# Patient Record
Sex: Female | Born: 2000 | Race: White | Hispanic: No | Marital: Single | Smoking: Never smoker
Health system: Southern US, Community
[De-identification: ages and names within clinical notes are randomized; demographics above are authoritative.]

## PROBLEM LIST (undated history)

## (undated) DIAGNOSIS — F988 Other specified behavioral and emotional disorders with onset usually occurring in childhood and adolescence: Secondary | ICD-10-CM

## (undated) HISTORY — PX: HERNIA REPAIR: SHX51

---
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
## 2020-03-15 DIAGNOSIS — F900 Attention-deficit hyperactivity disorder, predominantly inattentive type: Secondary | ICD-10-CM | POA: Diagnosis not present

## 2020-03-15 DIAGNOSIS — K580 Irritable bowel syndrome with diarrhea: Secondary | ICD-10-CM | POA: Diagnosis not present

## 2020-03-15 DIAGNOSIS — R1084 Generalized abdominal pain: Secondary | ICD-10-CM | POA: Diagnosis not present

## 2020-04-03 DIAGNOSIS — J0100 Acute maxillary sinusitis, unspecified: Secondary | ICD-10-CM | POA: Diagnosis not present

## 2020-04-26 DIAGNOSIS — K580 Irritable bowel syndrome with diarrhea: Secondary | ICD-10-CM | POA: Diagnosis not present

## 2020-04-26 DIAGNOSIS — F900 Attention-deficit hyperactivity disorder, predominantly inattentive type: Secondary | ICD-10-CM | POA: Diagnosis not present

## 2020-05-15 DIAGNOSIS — J3089 Other allergic rhinitis: Secondary | ICD-10-CM | POA: Diagnosis not present

## 2020-05-21 DIAGNOSIS — J00 Acute nasopharyngitis [common cold]: Secondary | ICD-10-CM | POA: Diagnosis not present

## 2020-06-06 DIAGNOSIS — Z20822 Contact with and (suspected) exposure to covid-19: Secondary | ICD-10-CM | POA: Diagnosis not present

## 2020-06-06 DIAGNOSIS — R059 Cough, unspecified: Secondary | ICD-10-CM | POA: Diagnosis not present

## 2020-06-06 DIAGNOSIS — J0140 Acute pansinusitis, unspecified: Secondary | ICD-10-CM | POA: Diagnosis not present

## 2020-06-06 DIAGNOSIS — Z1152 Encounter for screening for COVID-19: Secondary | ICD-10-CM | POA: Diagnosis not present

## 2020-06-20 DIAGNOSIS — R1030 Lower abdominal pain, unspecified: Secondary | ICD-10-CM | POA: Diagnosis not present

## 2020-06-20 DIAGNOSIS — K921 Melena: Secondary | ICD-10-CM | POA: Diagnosis not present

## 2020-06-20 DIAGNOSIS — R194 Change in bowel habit: Secondary | ICD-10-CM | POA: Diagnosis not present

## 2021-05-10 ENCOUNTER — Encounter: Payer: Self-pay | Admitting: Nurse Practitioner

## 2021-05-10 ENCOUNTER — Ambulatory Visit (INDEPENDENT_AMBULATORY_CARE_PROVIDER_SITE_OTHER): Payer: 59 | Admitting: Nurse Practitioner

## 2021-05-10 ENCOUNTER — Other Ambulatory Visit: Payer: Self-pay

## 2021-05-10 VITALS — BP 102/66 | HR 89 | Temp 98.2°F | Ht 64.0 in | Wt 133.0 lb

## 2021-05-10 DIAGNOSIS — F419 Anxiety disorder, unspecified: Secondary | ICD-10-CM | POA: Diagnosis not present

## 2021-05-10 DIAGNOSIS — H9203 Otalgia, bilateral: Secondary | ICD-10-CM

## 2021-05-10 DIAGNOSIS — F322 Major depressive disorder, single episode, severe without psychotic features: Secondary | ICD-10-CM | POA: Diagnosis not present

## 2021-05-10 MED ORDER — ESCITALOPRAM OXALATE 10 MG PO TABS: 10.0000 mg | ORAL_TABLET | Freq: Every day | ORAL | 5 refills | Status: AC

## 2021-05-10 MED ORDER — OFLOXACIN 0.3 % OT SOLN: 5.0000 [drp] | Freq: Every day | OTIC | 0 refills | Status: DC

## 2021-05-10 NOTE — Assessment & Plan Note (Signed)
Completed PHQ -9, started patient on Lexapro 10 mg tablet by mouth daily, follow up in 6 weeks. Education provided, RX sent to pharmacy,

## 2021-05-10 NOTE — Progress Notes (Signed)
New Patient Note  RE: Tracie Bowen MRN: [id number] DOB: June 11, 2001 Date of Office Visit: 05/10/2021  Chief Complaint: Establish Care  History of Present Illness: Depression: Patient complains of depression. She complains of depressed mood, difficulty concentrating, and fatigue. Onset was approximately a few years ago, gradually worsening since that time.  She denies current suicidal and homicidal plan or intent.   Family history significant for no psychiatric illness.Possible organic causes contributing are: none.  Risk factors: positive family history in  mother Previous treatment includes Prozac and none. She complains of the following side effects from the treatment:  ineffective .   Anxiety: Patient complains of anxiety disorder.  She has the following symptoms: feelings of losing control, irritable. Onset of symptoms was approximately a few years ago, rapidly worsening since that time. She denies current suicidal and homicidal ideation. Family history significant for  personality disorder .Possible organic causes contributing are: none. Risk factors: previous episode of depression Previous treatment includes Prozac and none.  She complains of the following side effects from the treatment:  not effective .   Otalgia  There is pain in both ears. This is a new problem. The current episode started in the past 7 days. The problem occurs constantly. The problem has been unchanged. There has been no fever. The pain is moderate. Pertinent negatives include no coughing, ear discharge, hearing loss, rash or sore throat. She has tried nothing (sudafed) for the symptoms. The treatment provided mild relief.    Flowsheet Row Office Visit from 05/10/2021 in [facility]  PHQ-9 Total Score 20        GAD 7 : Generalized Anxiety Score 05/10/2021  Nervous, Anxious, on Edge 3  Control/stop worrying 3  Worry too much - different things 3  Trouble relaxing 3  Restless 1   Easily annoyed or irritable 3  Afraid - awful might happen 1  Total GAD 7 Score 17       Assessment and Plan: Tracie Bowen is a 20 y.o. female with: Depression, major, single episode, severe (HCC) Completed PHQ -9, started patient on Lexapro 10 mg tablet by mouth daily, follow up in 6 weeks. Education provided, RX sent to pharmacy,   Anxiety Completed GAD-7, started patient on Lexapro 10 mg tablet by mouth daily, follow up in 6 weeks. Education provided, RX sent to pharmacy,   Return in about 6 weeks (around 06/21/2021) for depression.   Diagnostics:   Past Medical History: Patient Active Problem List   Diagnosis Date Noted   Depression, major, single episode, severe (HCC) 05/10/2021   Anxiety 05/10/2021   Otalgia of both ears 05/10/2021   Past Medical History:  Diagnosis Date   ADD (attention deficit disorder)    Past Surgical History:  Medication List:  Current Outpatient Medications  Medication Sig Dispense Refill   amphetamine-dextroamphetamine (ADDERALL) 10 MG tablet TAKE 1 TABLET BY MOUTH EVERY DAY IN LATE AFTERNOON FOR ATTENTION DEFICET WHILE STUDYING     escitalopram (LEXAPRO) 10 MG tablet Take 1 tablet (10 mg total) by mouth daily. 30 tablet 5   ofloxacin (FLOXIN OTIC) 0.3 % OTIC solution Place 5 drops into both ears daily. Use 7 days and discard 2 mL 0   No current facility-administered medications for this visit.   Allergies: Allergies  Allergen Reactions   Penicillins Hives   Social History: Social History   Socioeconomic History   Marital status: Single    Spouse name: Not on file   Number of children: Not  on file   Years of education: Not on file   Highest education level: Not on file  Occupational History   Not on file  Tobacco Use   Smoking status: Never   Smokeless tobacco: Never  Vaping Use   Vaping Use: Never used  Substance and Sexual Activity   Alcohol use: Yes    Alcohol/week: 1.0 standard drink    Types: 1 Glasses of wine per  week   Drug use: Never   Sexual activity: Not Currently  Other Topics Concern   Not on file  Social History Narrative   Not on file   Social Determinants of Health   Financial Resource Strain: Not on file  Food Insecurity: Not on file  Transportation Needs: Not on file  Physical Activity: Not on file  Stress: Not on file  Social Connections: Not on file       Family History: Family History  Problem Relation Age of Onset   COPD Mother          Review of Systems  Constitutional: Negative.   HENT:  Positive for ear pain. Negative for ear discharge, hearing loss and sore throat.   Eyes: Negative.   Respiratory: Negative.  Negative for cough.   Cardiovascular: Negative.   Gastrointestinal: Negative.   Genitourinary: Negative.   Musculoskeletal: Negative.   Skin:  Negative for rash.  Psychiatric/Behavioral: Negative.    All other systems reviewed and are negative. Objective: BP 102/66   Pulse 89   Temp 98.2 F (36.8 C) (Temporal)   Ht 5\' 4"  (1.626 m)   Wt 133 lb (60.3 kg)   LMP 05/03/2021   BMI 22.83 kg/m  Body mass index is 22.83 kg/m. Physical Exam Vitals and nursing note reviewed.  Constitutional:      Appearance: Normal appearance.  HENT:     Head: Normocephalic.     Right Ear: Tenderness present. There is no impacted cerumen.     Left Ear: There is no impacted cerumen.     Nose: Nose normal.  Eyes:     Conjunctiva/sclera: Conjunctivae normal.  Cardiovascular:     Rate and Rhythm: Normal rate and regular rhythm.     Pulses: Normal pulses.     Heart sounds: Normal heart sounds.  Pulmonary:     Effort: Pulmonary effort is normal.     Breath sounds: Normal breath sounds.  Abdominal:     General: Bowel sounds are normal.  Skin:    General: Skin is warm.     Findings: No rash.  Neurological:     Mental Status: She is alert and oriented to person, place, and time.  Psychiatric:        Attention and Perception: Attention and perception normal.         Mood and Affect: Affect normal. Mood is anxious and depressed.        Behavior: Behavior is cooperative.        Thought Content: Thought content normal. Thought content does not include suicidal ideation. Thought content does not include suicidal plan.        Cognition and Memory: Cognition normal.        Judgment: Judgment normal.   The plan was reviewed with the patient/family, and all questions/concerned were addressed.  It was my pleasure to see Tracie Bowen today and participate in her care. Please feel free to contact me with any questions or concerns.  Sincerely,  Toni Amend NP [facility]

## 2021-05-10 NOTE — Assessment & Plan Note (Signed)
Completed GAD-7, started patient on Lexapro 10 mg tablet by mouth daily, follow up in 6 weeks. Education provided, RX sent to pharmacy,

## 2021-05-10 NOTE — Patient Instructions (Signed)
Earache, Adult An earache, or ear pain, can be caused by many things, including: An infection. Ear wax buildup. Ear pressure. Something in the ear that should not be there (foreign body). A sore throat. Tooth problems. Jaw problems. Treatment of the earache will depend on the cause. If the cause is not clear or cannot be determined, you may need to watch your symptoms until your earache goes away or until a cause is found. Follow these instructions at home: Medicines Take or apply over-the-counter and prescription medicines only as told by your health care provider. If you were prescribed an antibiotic medicine, use it as told by your health care provider. Do not stop using the antibiotic even if you start to feel better. Do not put anything in your ear other than medicine that is prescribed by your health care provider. Managing pain If directed, apply heat to the affected area as often as told by your health care provider. Use the heat source that your health care provider recommends, such as a moist heat pack or a heating pad. Place a towel between your skin and the heat source. Leave the heat on for 20-30 minutes. Remove the heat if your skin turns bright red. This is especially important if you are unable to feel pain, heat, or cold. You may have a greater risk of getting burned. If directed, put ice on the affected area as often as told by your health care provider. To do this:   Put ice in a plastic bag. Place a towel between your skin and the bag. Leave the ice on for 20 minutes, 2-3 times a day. General instructions Pay attention to any changes in your symptoms. Try resting in an upright position instead of lying down. This may help to reduce pressure in your ear and relieve pain. Chew gum if it helps to relieve your ear pain. Treat any allergies as told by your health care provider. Drink enough fluid to keep your urine pale yellow. It is up to you to get the results of any  tests that were done. Ask your health care provider, or the department that is doing the tests, when your results will be ready. Keep all follow-up visits as told by your health care provider. This is important. Contact a health care provider if: Your pain does not improve within 2 days. Your earache gets worse. You have new symptoms. You have a fever. Get help right away if you: Have a severe headache. Have a stiff neck. Have trouble swallowing. Have redness or swelling behind your ear. Have fluid or blood coming from your ear. Have hearing loss. Feel dizzy. Summary An earache, or ear pain, can be caused by many things. Treatment of the earache will depend on the cause. Follow recommendations from your health care provider to treat your ear pain. If the cause is not clear or cannot be determined, you may need to watch your symptoms until your earache goes away or until a cause is found. Keep all follow-up visits as told by your health care provider. This is important. This information is not intended to replace advice given to you by your health care provider. Make sure you discuss any questions you have with your health care provider. Document Revised: 02/21/2019 Document Reviewed: 02/21/2019 Elsevier Patient Education  2022 Elsevier Inc. Managing Anxiety, Adult After being diagnosed with an anxiety disorder, you may be relieved to know why you have felt or behaved a certain way. You may also feel overwhelmed  about the treatment ahead and what it will mean for your life. With care and support, you can manage this condition and recover from it. How to manage lifestyle changes Managing stress and anxiety Stress is your body's reaction to life changes and events, both good and bad. Most stress will last just a few hours, but stress can be ongoing and can lead to more than just stress. Although stress can play a major role in anxiety, it is not the same as anxiety. Stress is usually caused  by something external, such as a deadline, test, or competition. Stress normally passes after the triggering event has ended.  Anxiety is caused by something internal, such as imagining a terrible outcome or worrying that something will go wrong that will devastate you. Anxiety often does not go away even after the triggering event is over, and it can become long-term (chronic) worry. It is important to understand the differences between stress and anxiety and to manage your stress effectively so that it does not lead to an anxious response. Talk with your health care provider or a counselor to learn more about reducing anxiety and stress. He or she may suggest tension reduction techniques, such as: Music therapy. This can include creating or listening to music that you enjoy and that inspires you. Mindfulness-based meditation. This involves being aware of your normal breaths while not trying to control your breathing. It can be done while sitting or walking. Centering prayer. This involves focusing on a word, phrase, or sacred image that means something to you and brings you peace. Deep breathing. To do this, expand your stomach and inhale slowly through your nose. Hold your breath for 3-5 seconds. Then exhale slowly, letting your stomach muscles relax. Self-talk. This involves identifying thought patterns that lead to anxiety reactions and changing those patterns. Muscle relaxation. This involves tensing muscles and then relaxing them. Choose a tension reduction technique that suits your lifestyle and personality. These techniques take time and practice. Set aside 5-15 minutes a day to do them. Therapists can offer counseling and training in these techniques. The training to help with anxiety may be covered by some insurance plans. Other things you can do to manage stress and anxiety include: Keeping a stress/anxiety diary. This can help you learn what triggers your reaction and then learn ways to manage  your response. Thinking about how you react to certain situations. You may not be able to control everything, but you can control your response. Making time for activities that help you relax and not feeling guilty about spending your time in this way. Visual imagery and yoga can help you stay calm and relax.  Medicines Medicines can help ease symptoms. Medicines for anxiety include: Anti-anxiety drugs. Antidepressants. Medicines are often used as a primary treatment for anxiety disorder. Medicines will be prescribed by a health care provider. When used together, medicines, psychotherapy, and tension reduction techniques may be the most effective treatment. Relationships Relationships can play a big part in helping you recover. Try to spend more time connecting with trusted friends and family members. Consider going to couples counseling, taking family education classes, or going to family therapy. Therapy can help you and others better understand your condition. How to recognize changes in your anxiety Everyone responds differently to treatment for anxiety. Recovery from anxiety happens when symptoms decrease and stop interfering with your daily activities at home or work. This may mean that you will start to: Have better concentration and focus. Worry will interfere  less in your daily thinking. Sleep better. Be less irritable. Have more energy. Have improved memory. It is important to recognize when your condition is getting worse. Contact your health care provider if your symptoms interfere with home or work and you feel like your condition is not improving. Follow these instructions at home: Activity Exercise. Most adults should do the following: Exercise for at least 150 minutes each week. The exercise should increase your heart rate and make you sweat (moderate-intensity exercise). Strengthening exercises at least twice a week. Get the right amount and quality of sleep. Most adults  need 7-9 hours of sleep each night. Lifestyle  Eat a healthy diet that includes plenty of vegetables, fruits, whole grains, low-fat dairy products, and lean protein. Do not eat a lot of foods that are high in solid fats, added sugars, or salt. Make choices that simplify your life. Do not use any products that contain nicotine or tobacco, such as cigarettes, e-cigarettes, and chewing tobacco. If you need help quitting, ask your health care provider. Avoid caffeine, alcohol, and certain over-the-counter cold medicines. These may make you feel worse. Ask your pharmacist which medicines to avoid. General instructions Take over-the-counter and prescription medicines only as told by your health care provider. Keep all follow-up visits as told by your health care provider. This is important. Where to find support You can get help and support from these sources: Self-help groups. Online and [employer]. A trusted spiritual leader. Couples counseling. Family education classes. Family therapy. Where to find more information You may find that joining a support group helps you deal with your anxiety. The following sources can help you locate counselors or support groups near you: Mental Health America: www.mentalhealthamerica.net Anxiety and Depression Association of [place] (ADAA): ProgramCam.de [employer] on Mental Illness (NAMI): www.nami.org Contact a health care provider if you: Have a hard time staying focused or finishing daily tasks. Spend many hours a day feeling worried about everyday life. Become exhausted by worry. Start to have headaches, feel tense, or have nausea. Urinate more than normal. Have diarrhea. Get help right away if you have: A racing heart and shortness of breath. Thoughts of hurting yourself or others. If you ever feel like you may hurt yourself or others, or have thoughts about taking your own life, get help right away. You can go to your nearest  emergency department or call: Your local emergency services (911 in the [place] A suicide crisis helpline, such as the National Suicide Prevention Lifeline at 272-778-2413. This is open 24 hours a day. Summary Taking steps to learn and use tension reduction techniques can help calm you and help prevent triggering an anxiety reaction. When used together, medicines, psychotherapy, and tension reduction techniques may be the most effective treatment. Family, friends, and partners can play a big part in helping you recover from an anxiety disorder. This information is not intended to replace advice given to you by your health care provider. Make sure you discuss any questions you have with your health care provider. Document Revised: 12/16/2018 Document Reviewed: 12/16/2018 Elsevier Patient Education  2022 Elsevier Inc. Major Depressive Disorder, Adult Major depressive disorder (MDD) is a mental health condition. It may also be called clinical depression or unipolar depression. MDD causes symptoms of sadness, hopelessness, and loss of interest in things. These symptoms last most of the day, almost every day, for 2 weeks. MDD can also cause physical symptoms. It can interfere with relationships and with everyday activities, such as work, school, and  activities that are usually pleasant. MDD may be mild, moderate, or severe. It may be single-episode MDD, which happens once, or recurrent MDD, which may occur multiple times. What are the causes? The exact cause of this condition is not known. MDD is most likely caused by a combination of things, which may include: Your personality traits. Learned or conditioned behaviors or thoughts or feelings that reinforce negativity. Any alcohol or substance misuse. Long-term (chronic) physical or mental health illness. Going through a traumatic experience or major life changes. What increases the risk? The following factors may make someone more likely to develop  MDD: A family history of depression. Being a woman. Troubled family relationships. Abnormally low levels of certain brain chemicals. Traumatic or painful events in childhood, especially abuse or loss of a parent. A lot of stress from life experiences, such as poor living conditions or discrimination. Chronic physical illness or other mental health disorders. What are the signs or symptoms? The main symptoms of MDD usually include: Constant depressed or irritable mood. A loss of interest in things and activities. Other symptoms include: Sleeping or eating too much or too little. Unexplained weight gain or weight loss. Tiredness or low energy. Being agitated, restless, or weak. Feeling hopeless, worthless, or guilty. Trouble thinking clearly or making decisions. Thoughts of suicide or thoughts of harming others. Isolating oneself or avoiding other people or activities. Trouble completing tasks, work, or any normal obligations. Severe symptoms of this condition may include: Psychotic depression.This may include false beliefs, or delusions. It may also include seeing, hearing, tasting, smelling, or feeling things that are not real (hallucinations). Chronic depression or persistent depressive disorder. This is low-level depression that lasts for at least 2 years. Melancholic depression, or feeling extremely sad and hopeless. Catatonic depression, which includes trouble speaking and trouble moving. How is this diagnosed? This condition may be diagnosed based on: Your symptoms. Your medical and mental health history. You may be asked questions about your lifestyle, including any drug and alcohol use. A physical exam. Blood tests to rule out other conditions. MDD is confirmed if you have the following symptoms most of the day, nearly every day, in a 2-week period: Either a depressed mood or loss of interest. At least four other MDD symptoms. How is this treated? This condition is usually  treated by mental health professionals, such as psychologists, psychiatrists, and clinical social workers. You may need more than one type of treatment. Treatment may include: Psychotherapy, also called talk therapy or counseling. Types of psychotherapy include: Cognitive behavioral therapy (CBT). This teaches you to recognize unhealthy feelings, thoughts, and behaviors, and replace them with positive thoughts and actions. Interpersonal therapy (IPT). This helps you to improve the way you communicate with others or relate to them. Family therapy. This treatment includes members of your family. Medicines to treat anxiety and depression. These medicines help to balance the brain chemicals that affect your emotions. Lifestyle changes. You may be asked to: Limit alcohol use and avoid drug use. Get regular exercise. Get plenty of sleep. Make healthy eating choices. Spend more time outdoors. Brain stimulation. This may be done if symptoms are very severe and other treatments have not worked. Examples of this treatment are electroconvulsive therapy and transcranial magnetic stimulation. Follow these instructions at home: Activity Exercise regularly and spend time outdoors. Find activities that you enjoy doing, and make time to do them. Find healthy ways to manage stress, such as: Meditation or deep breathing. Spending time in nature. Journaling. Return to your  normal activities as told by your health care provider. Ask your health care provider what activities are safe for you. Alcohol and drug use If you drink alcohol: Limit how much you use to: 0-1 drink a day for women who are not pregnant. 0-2 drinks a day for men. Be aware of how much alcohol is in your drink. In the [place] one drink equals one 12 oz bottle of beer (355 mL), one 5 oz glass of wine (148 mL), or one 1 oz glass of hard liquor (44 mL). Discuss your alcohol use with your health care provider. Alcohol can affect any  antidepressant medicines you are taking. Discuss any drug use with your health care provider. General instructions  Take over-the-counter and prescription medicines only as told by your health care provider. Eat a healthy diet and get plenty of sleep. Consider joining a support group. Your health care provider may be able to recommend one. Keep all follow-up visits as told by your health care provider. This is important. Where to find more information The [employer] on Mental Illness: www.nami.org [place] [employer] of Mental Health: http://www.maynard.net/ Contact a health care provider if: Your symptoms get worse. You develop new symptoms. Get help right away if: You self-harm. You have serious thoughts about hurting yourself or others. You hallucinate. If you ever feel like you may hurt yourself or others, or have thoughts about taking your own life, get help right away. Go to your nearest emergency department or: Call your local emergency services (911 in the [place] Call a suicide crisis helpline, such as the National Suicide Prevention Lifeline at 510-744-3409. This is open 24 hours a day in the [place] Text the Crisis Text Line at (934)639-8695 (in the [place] Summary Major depressive disorder (MDD) is a mental health condition. MDD causes symptoms of sadness, hopelessness, and loss of interest in things. These symptoms last most of the day, almost every day, for 2 weeks. The symptoms of MDD can interfere with relationships and with everyday activities. Treatments and support are available for people who develop MDD. You may need more than one type of treatment. Get help right away if you have serious thoughts about hurting yourself or others. This information is not intended to replace advice given to you by your health care provider. Make sure you discuss any questions you have with your health care provider. Document Revised: 06/27/2019 Document Reviewed: 06/27/2019 Elsevier Patient  Education  2022 [employer].

## 2021-05-30 ENCOUNTER — Ambulatory Visit
Admission: EM | Admit: 2021-05-30 | Discharge: 2021-05-30 | Disposition: A | Discharge status: Home / Self Care | Payer: 59 | Attending: Family Medicine | Admitting: Family Medicine

## 2021-05-30 DIAGNOSIS — R1084 Generalized abdominal pain: Secondary | ICD-10-CM | POA: Diagnosis present

## 2021-05-30 LAB — POCT URINALYSIS DIP (MANUAL ENTRY)
Bilirubin, UA: NEGATIVE
Glucose, UA: NEGATIVE mg/dL
Ketones, POC UA: NEGATIVE mg/dL
Leukocytes, UA: NEGATIVE
Nitrite, UA: NEGATIVE
Protein Ur, POC: NEGATIVE mg/dL
Spec Grav, UA: 1.02 (ref 1.010–1.025)
Urobilinogen, UA: 0.2 E.U./dL
pH, UA: 6.5 (ref 5.0–8.0)

## 2021-05-30 LAB — POCT URINE PREGNANCY: Preg Test, Ur: NEGATIVE

## 2021-05-30 MED ORDER — ONDANSETRON 4 MG PO TBDP: 4.0000 mg | ORAL_TABLET | Freq: Three times a day (TID) | ORAL | 0 refills | Status: AC | PRN

## 2021-05-30 NOTE — ED Triage Notes (Signed)
Patient presents to Urgent Care with complaints of abdominal pain, nausea, and decreased po intake since Sunday. Pt states she is unsure if related to handling raw chicken at work or from drinking well fever. Treating symptoms with pepto with no relief. Has a hx of IBS.

## 2021-05-30 NOTE — Discharge Instructions (Addendum)
Please do your best to ensure adequate fluid intake in order to avoid dehydration. If you find that you are unable to tolerate drinking fluids regularly please proceed to the Emergency Department for evaluation. ° ° °

## 2021-05-31 LAB — URINE CULTURE: Special Requests: NORMAL

## 2021-05-31 NOTE — ED Provider Notes (Signed)
[facility]   [id number] 05/30/21 Arrival Time: 1704  ASSESSMENT & PLAN:  1. Generalized abdominal pain    Most likely viral gastroenteritis. Tolerating PO intake. Benign abdominal exam. No indications for urgent abdominal/pelvic imaging at this time. Discussed.  Meds ordered this encounter  Medications   ondansetron (ZOFRAN-ODT) 4 MG disintegrating tablet    Sig: Take 1 tablet (4 mg total) by mouth every 8 (eight) hours as needed for nausea or vomiting.    Dispense:  15 tablet    Refill:  0     Discharge Instructions      Please do your best to ensure adequate fluid intake in order to avoid dehydration. If you find that you are unable to tolerate drinking fluids regularly please proceed to the Emergency Department for evaluation.      Follow-up Information     Daryll Drown, NP.   Specialty: Nurse Practitioner Why: As needed. Contact information: [address] [place] [address] [phone]                Urine without signs of infection. UPT negative.  Reviewed expectations re: course of current medical issues. Questions answered. Outlined signs and symptoms indicating need for more acute intervention. Patient verbalized understanding. After Visit Summary given.   SUBJECTIVE: History from: patient. Tracie Bowen is a 20 y.o. female who presents with complaint of lower abd cramping. Nausea. Loose stools. No blood. Afebrile. Appetite: decreased. PO intake: decreased. Ambulatory without assistance. Urinary symptoms: none. OTC treatment: none.  Patient's last menstrual period was 05/21/2021 (approximate).  Past Surgical History:  Procedure Laterality Date   HERNIA REPAIR       OBJECTIVE:  Vitals:   05/30/21 1907  BP: 116/80  Pulse: (!) 101  Resp: 16  Temp: 98.5 F (36.9 C)  TempSrc: Oral  SpO2: 98%    General appearance: alert, oriented, no acute distress HEENT: [place]; AT; oropharynx moist Lungs: unlabored  respirations Abdomen: soft; without distention; no specific tenderness to palpation; normal bowel sounds; without masses or organomegaly; without guarding or rebound tenderness Back: without reported CVA tenderness; FROM at waist Extremities: without LE edema; symmetrical; without gross deformities Skin: warm and dry Neurologic: normal gait Psychological: alert and cooperative; normal mood and affect  Labs: Results for orders placed or performed during the hospital encounter of 05/30/21  POCT urinalysis dipstick  Result Value Ref Range   Color, UA yellow yellow   Clarity, UA clear clear   Glucose, UA negative negative mg/dL   Bilirubin, UA negative negative   Ketones, POC UA negative negative mg/dL   Spec Grav, UA 2.992 4.268 - 1.025   Blood, UA trace-intact (A) negative   pH, UA 6.5 5.0 - 8.0   Protein Ur, POC negative negative mg/dL   Urobilinogen, UA 0.2 0.2 or 1.0 E.U./dL   Nitrite, UA Negative Negative   Leukocytes, UA Negative Negative  POCT urine pregnancy  Result Value Ref Range   Preg Test, Ur Negative Negative   Labs Reviewed  POCT URINALYSIS DIP (MANUAL ENTRY) - Abnormal; Notable for the following components:      Result Value   Blood, UA trace-intact (*)    All other components within normal limits  URINE CULTURE  POCT URINE PREGNANCY      Allergies  Allergen Reactions   Penicillins Hives  Past Medical History:  Diagnosis Date   ADD (attention deficit disorder)     Social History   Socioeconomic History   Marital status: Single    Spouse name: Not on file   Number of children: Not on file   Years of education: Not on file   Highest education level: Not on file  Occupational History   Not on file  Tobacco Use   Smoking status: Never   Smokeless tobacco: Never  Vaping Use   Vaping Use: Never used  Substance and Sexual Activity   Alcohol use: Yes    Alcohol/week: 1.0 standard drink    Types: 1  Glasses of wine per week   Drug use: Never   Sexual activity: Not Currently  Other Topics Concern   Not on file  Social History Narrative   Not on file   Social Determinants of Health   Financial Resource Strain: Not on file  Food Insecurity: Not on file  Transportation Needs: Not on file  Physical Activity: Not on file  Stress: Not on file  Social Connections: Not on file  Intimate Partner Violence: Not on file    Family History  Problem Relation Age of Onset   COPD Mother      Mardella Layman, MD 05/31/21 [phone]

## 2021-12-05 ENCOUNTER — Ambulatory Visit (INDEPENDENT_AMBULATORY_CARE_PROVIDER_SITE_OTHER): Payer: BC Managed Care – PPO | Admitting: Nurse Practitioner

## 2021-12-05 ENCOUNTER — Encounter: Payer: Self-pay | Admitting: Nurse Practitioner

## 2021-12-05 VITALS — BP 121/67 | HR 71 | Temp 98.7°F | Ht 61.0 in | Wt 122.0 lb

## 2021-12-05 DIAGNOSIS — Z0000 Encounter for general adult medical examination without abnormal findings: Secondary | ICD-10-CM

## 2021-12-05 DIAGNOSIS — Z136 Encounter for screening for cardiovascular disorders: Secondary | ICD-10-CM | POA: Diagnosis not present

## 2021-12-05 NOTE — Patient Instructions (Signed)

## 2021-12-05 NOTE — Assessment & Plan Note (Signed)
Complete physical annual exam no new concerns.  Labs completed CBC, CMP, lipid panel.  Follow-up in 1 year. ? ?Education provided to patient printed handouts given. ?

## 2021-12-05 NOTE — Progress Notes (Signed)
? ?Established Patient Office Visit ? ?Subjective   ?Patient ID: Tracie Bowen, female    DOB: 2001/01/07  Age: 21 y.o. MRN: [id number] ? ?Chief Complaint  ?Patient presents with  ? bloodwork  ?  Needs basic lab work for new job  ? ? ?HPI ?.   ?Encounter for general adult medical examination ?Physical ("At Risk" items are starred): Patient's last physical exam was 1 year ago .  ?Weight: Appropriate for height (BMI less than 27%) ;  ?Blood Pressure: Normal (BP less than 120/80) ;  ?Medical History: Patient history reviewed ; Family history reviewed ;  ?Allergies Reviewed: No change in current allergies ;  ?Medications Reviewed: Medications reviewed - no changes ;  ?Lipids: Normal lipid levels ;  ?Smoking: Life-long non-smoker ;  ?Physical Activity: Exercises at least 3 times per week ;  ?Alcohol/Drug Use: Is a non-drinker ; No illicit drug use ;  ?Patient is not afflicted from Stress Incontinence and Urge Incontinence  ?Safety: reviewed ; Patient wears a seat belt, has smoke detectors, has carbon monoxide detectors, and wears sunscreen with extended sun exposure. ?Dental Care: biannual cleanings, brushes and flosses daily. ?Ophthalmology/Optometry: Annual visit.  ?Hearing loss: none ?Vision impairments: none  ?Patient Active Problem List  ? Diagnosis Date Noted  ? Physical exam, annual 12/05/2021  ? Depression, major, single episode, severe ([place]) 05/10/2021  ? Anxiety 05/10/2021  ? Otalgia of both ears 05/10/2021  ? ?Past Medical History:  ?Diagnosis Date  ? ADD (attention deficit disorder)   ? ?Past Surgical History:  ?Procedure Laterality Date  ? HERNIA REPAIR    ? ?Social History  ? ?Tobacco Use  ? Smoking status: Never  ? Smokeless tobacco: Never  ?Vaping Use  ? Vaping Use: Never used  ?Substance Use Topics  ? Alcohol use: Yes  ?  Alcohol/week: 1.0 standard drink  ?  Types: 1 Glasses of wine per week  ? Drug use: Never  ? ?Social History  ? ?Socioeconomic History  ? Marital status: Single  ?  Spouse name: Not  on file  ? Number of children: Not on file  ? Years of education: Not on file  ? Highest education level: Not on file  ?Occupational History  ? Not on file  ?Tobacco Use  ? Smoking status: Never  ? Smokeless tobacco: Never  ?Vaping Use  ? Vaping Use: Never used  ?Substance and Sexual Activity  ? Alcohol use: Yes  ?  Alcohol/week: 1.0 standard drink  ?  Types: 1 Glasses of wine per week  ? Drug use: Never  ? Sexual activity: Not Currently  ?Other Topics Concern  ? Not on file  ?Social History Narrative  ? Not on file  ? ?Social Determinants of Health  ? ?Financial Resource Strain: Not on file  ?Food Insecurity: Not on file  ?Transportation Needs: Not on file  ?Physical Activity: Not on file  ?Stress: Not on file  ?Social Connections: Not on file  ?Intimate Partner Violence: Not on file  ? ?Family Status  ?Relation Name Status  ? Mother  Alive  ? Father  Other  ? ?Family History  ?Problem Relation Age of Onset  ? COPD Mother   ? ?Allergies  ?Allergen Reactions  ? Penicillins Hives  ? ?  ? ?Review of Systems  ?HENT: Negative.    ?Respiratory: Negative.    ?Cardiovascular: Negative.   ?Gastrointestinal: Negative.   ?Genitourinary: Negative.   ?Musculoskeletal: Negative.   ?Skin: Negative.   ?Neurological: Negative.   ?  Psychiatric/Behavioral: Negative.    ?All other systems reviewed and are negative. ? ?  ?Objective:  ?  ? ?BP 121/67 (BP Location: Right Arm, Patient Position: Sitting, Cuff Size: Normal)   Pulse 71   Temp 98.7 ?F (37.1 ?C) (Temporal)   Ht 5' 1"  (1.549 m)   Wt 122 lb (55.3 kg)   SpO2 96%   BMI 23.05 kg/m?  ?BP Readings from Last 3 Encounters:  ?12/05/21 121/67  ?05/30/21 116/80  ?05/10/21 102/66  ? ?Wt Readings from Last 3 Encounters:  ?12/05/21 122 lb (55.3 kg)  ?05/10/21 133 lb (60.3 kg)  ?04/14/16 120 lb (54.4 kg) (46 %, Z= -0.09)*  ? ?* Growth percentiles are based on CDC (Girls, 2-20 Years) data.  ? ?  ? ?Physical Exam ?Vitals and nursing note reviewed.  ?Constitutional:   ?   Appearance: Normal  appearance.  ?HENT:  ?   Head: Normocephalic.  ?   Right Ear: External ear normal.  ?   Left Ear: External ear normal.  ?   Nose: Nose normal.  ?   Mouth/Throat:  ?   Mouth: Mucous membranes are moist.  ?   Pharynx: Oropharynx is clear.  ?Eyes:  ?   Conjunctiva/sclera: Conjunctivae normal.  ?Cardiovascular:  ?   Rate and Rhythm: Normal rate and regular rhythm.  ?   Pulses: Normal pulses.  ?   Heart sounds: Normal heart sounds.  ?Pulmonary:  ?   Effort: Pulmonary effort is normal.  ?   Breath sounds: Normal breath sounds.  ?Abdominal:  ?   General: Bowel sounds are normal.  ?Skin: ?   General: Skin is warm.  ?Neurological:  ?   General: No focal deficit present.  ?   Mental Status: She is alert and oriented to person, place, and time.  ?Psychiatric:     ?   Mood and Affect: Mood normal.     ?   Behavior: Behavior normal.  ? ? ? ?No results found for any visits on 12/05/21. ? ?Last CBC ?No results found for: WBC, HGB, HCT, MCV, MCH, RDW, PLT ?Last metabolic panel ?No results found for: GLUCOSE, NA, K, CL, CO2, BUN, CREATININE, EGFR, CALCIUM, PHOS, PROT, ALBUMIN, LABGLOB, AGRATIO, BILITOT, ALKPHOS, AST, ALT, ANIONGAP ?Last lipids ?No results found for: CHOL, HDL, LDLCALC, LDLDIRECT, TRIG, CHOLHDL ?  ? ?The ASCVD Risk score (Arnett DK, et al., 2019) failed to calculate for the following reasons: ?  The 2019 ASCVD risk score is only valid for ages 4 to 63 ? ?  ?Assessment & Plan:  ? ?Problem List Items Addressed This Visit   ? ?  ? Other  ? Physical exam, annual - Primary  ?  Complete physical annual exam no new concerns.  Labs completed CBC, CMP, lipid panel.  Follow-up in 1 year. ? ?Education provided to patient printed handouts given. ? ?  ?  ? Relevant Orders  ? CBC with Differential  ? Comprehensive metabolic panel  ? Lipid Panel  ? ? ?No follow-ups on file.  ? ? ?Ivy Lynn, NP ? ?

## 2021-12-06 LAB — CBC WITH DIFFERENTIAL/PLATELET
Basophils Absolute: 0 10*3/uL (ref 0.0–0.2)
Basos: 0 %
EOS (ABSOLUTE): 0.1 10*3/uL (ref 0.0–0.4)
Eos: 1 %
Hematocrit: 39.8 % (ref 34.0–46.6)
Hemoglobin: 13.3 g/dL (ref 11.1–15.9)
Immature Grans (Abs): 0 10*3/uL (ref 0.0–0.1)
Immature Granulocytes: 0 %
Lymphocytes Absolute: 2.8 10*3/uL (ref 0.7–3.1)
Lymphs: 42 %
MCH: 31.1 pg (ref 26.6–33.0)
MCHC: 33.4 g/dL (ref 31.5–35.7)
MCV: 93 fL (ref 79–97)
Monocytes Absolute: 0.7 10*3/uL (ref 0.1–0.9)
Monocytes: 11 %
Neutrophils Absolute: 3 10*3/uL (ref 1.4–7.0)
Neutrophils: 46 %
Platelets: 294 10*3/uL (ref 150–450)
RBC: 4.28 x10E6/uL (ref 3.77–5.28)
RDW: 12 % (ref 11.7–15.4)
WBC: 6.7 10*3/uL (ref 3.4–10.8)

## 2021-12-06 LAB — LIPID PANEL
Chol/HDL Ratio: 2.5 ratio (ref 0.0–4.4)
Cholesterol, Total: 122 mg/dL (ref 100–199)
HDL: 49 mg/dL (ref >39)
LDL Chol Calc (NIH): 64 mg/dL (ref 0–99)
Triglycerides: 32 mg/dL (ref 0–149)
VLDL Cholesterol Cal: 9 mg/dL (ref 5–40)

## 2021-12-06 LAB — COMPREHENSIVE METABOLIC PANEL
ALT: 15 IU/L (ref 0–32)
AST: 20 IU/L (ref 0–40)
Albumin/Globulin Ratio: 2.3 — ABNORMAL HIGH (ref 1.2–2.2)
Albumin: 4.8 g/dL (ref 3.9–5.0)
Alkaline Phosphatase: 60 IU/L (ref 44–121)
BUN/Creatinine Ratio: 6 — ABNORMAL LOW (ref 9–23)
BUN: 5 mg/dL — ABNORMAL LOW (ref 6–20)
Bilirubin Total: 0.4 mg/dL (ref 0.0–1.2)
CO2: 24 mmol/L (ref 20–29)
Calcium: 9.5 mg/dL (ref 8.7–10.2)
Chloride: 104 mmol/L (ref 96–106)
Creatinine, Ser: 0.8 mg/dL (ref 0.57–1.00)
Globulin, Total: 2.1 g/dL (ref 1.5–4.5)
Glucose: 93 mg/dL (ref 70–99)
Potassium: 4.3 mmol/L (ref 3.5–5.2)
Sodium: 141 mmol/L (ref 134–144)
Total Protein: 6.9 g/dL (ref 6.0–8.5)
eGFR: 107 mL/min/{1.73_m2} (ref >59)

## 2022-03-27 DIAGNOSIS — F419 Anxiety disorder, unspecified: Secondary | ICD-10-CM | POA: Diagnosis not present

## 2022-03-27 DIAGNOSIS — K580 Irritable bowel syndrome with diarrhea: Secondary | ICD-10-CM | POA: Diagnosis not present

## 2022-03-27 DIAGNOSIS — R7302 Impaired glucose tolerance (oral): Secondary | ICD-10-CM | POA: Diagnosis not present

## 2022-03-27 DIAGNOSIS — F332 Major depressive disorder, recurrent severe without psychotic features: Secondary | ICD-10-CM | POA: Diagnosis not present

## 2024-03-25 NOTE — Progress Notes (Signed)
 Subjective   Patient ID:  Tracie Bowen is a 23 y.o. (DOB 05-Dec-2000) female.     Patient presents with   Follow-up    [place],     HPI: Tracie Bowen is here today for evaluation of painful and heavy periods. She also had CT scan recently that may have revealed fibroids. She is concerned about possible endometriosis and is hoping to conceive. Her dysmenorrhea did not respond to BCPs or Nexplanon.  Past Medical History, Past Surgery History, Allergies, Social History, and Family History were reviewed and updated.    Review of Systems is complete and negative except as noted.  Objective   BP 120/80 (Patient Position: Sitting)   Wt 115 lb (52.2 kg)   LMP 03/11/2024 (Approximate)   BMI 21.73 kg/m   Chaperone: declined  General:  Well Developed, Well Nourished, No distress Ultrasound:  Uterus: 7.54 x 5.37 x 3.88 cm. Anteverted, myometrium homogeneous throughout Endometrial stripe: 11.46 mm, regular and trilaminar Fibroids: none seen Endometrial mass: none seen Cervix: normal Right ovary: normal, no masses Left ovary: normal, no masses Other findings of note: no adnexal masses and no free pelvic fluid      Impression   1. Dysmenorrhea     Plan  We reviewed her ultrasound report and images. Normal gyn anatomy and no evidence of fibroids discussed. No obvious signs of fertility issues seen. Recommend intercourse every 2-3 days and if no pregnancy in one year would obtain a semen analysis. Daily PNV recommended. All questions answered.     Patient's Medications       * Accurate as of March 26, 2024 10:50 AM. Reflects encounter med changes as of last refresh          Continued Medications      Instructions  Azelaic Acid 15 % Gel  1 Application   busPIRone 15 MG tablet Commonly known as: BUSPAR  15 mg, 3 times a day   diclofenac sodium 75 mg EC tablet Commonly known as: VOLTAREN  75 mg, Oral, 2 times a day   dicyclomine 10 mg capsule Commonly known as:  BENTYL  10 mg, Oral, 3 times a day as needed   guanFACINE ER 2 mg Tb24 Commonly known as: INTUNIV  2 mg, Every morning   metroNIDAZOLE 500 mg tablet Commonly known as: FLAGYL  500 mg, Oral, 2 times a day, Take 1 tablet twice daily for 7 days.   valACYclovir 500 mg tablet Commonly known as: VALTREX  500 mg, Oral, Daily        No orders of the defined types were placed in this encounter.   Risks, benefits, and alternatives of the medications and treatment plan prescribed today were discussed, and patient expressed understanding. Plan follow-up as discussed or as needed if any worsening symptoms or change in condition.    A yearly preventative health exam was recommended and current age based recommendations were discussed.  *Some images could not be shown.

## 2024-07-25 ENCOUNTER — Emergency Department (HOSPITAL_COMMUNITY)

## 2024-07-25 ENCOUNTER — Encounter (HOSPITAL_COMMUNITY): Payer: Self-pay

## 2024-07-25 ENCOUNTER — Other Ambulatory Visit: Payer: Self-pay

## 2024-07-25 ENCOUNTER — Emergency Department (HOSPITAL_COMMUNITY)
Admission: EM | Admit: 2024-07-25 | Discharge: 2024-07-25 | Disposition: A | Discharge status: Home / Self Care | Attending: Emergency Medicine | Admitting: Emergency Medicine

## 2024-07-25 DIAGNOSIS — O3680X0 Pregnancy with inconclusive fetal viability, not applicable or unspecified: Secondary | ICD-10-CM | POA: Diagnosis not present

## 2024-07-25 DIAGNOSIS — Z3A01 Less than 8 weeks gestation of pregnancy: Secondary | ICD-10-CM | POA: Diagnosis not present

## 2024-07-25 DIAGNOSIS — O209 Hemorrhage in early pregnancy, unspecified: Secondary | ICD-10-CM | POA: Diagnosis present

## 2024-07-25 LAB — CBC WITH DIFFERENTIAL/PLATELET
Abs Immature Granulocytes: 0.02 K/uL (ref 0.00–0.07)
Basophils Absolute: 0 K/uL (ref 0.0–0.1)
Basophils Relative: 0 %
Eosinophils Absolute: 0.1 K/uL (ref 0.0–0.5)
Eosinophils Relative: 1 %
HCT: 37.1 % (ref 36.0–46.0)
Hemoglobin: 12.8 g/dL (ref 12.0–15.0)
Immature Granulocytes: 0 %
Lymphocytes Relative: 16 %
Lymphs Abs: 1.5 K/uL (ref 0.7–4.0)
MCH: 31.5 pg (ref 26.0–34.0)
MCHC: 34.5 g/dL (ref 30.0–36.0)
MCV: 91.4 fL (ref 80.0–100.0)
Monocytes Absolute: 0.8 K/uL (ref 0.1–1.0)
Monocytes Relative: 8 %
Neutro Abs: 7.4 K/uL (ref 1.7–7.7)
Neutrophils Relative %: 75 %
Platelets: 273 K/uL (ref 150–400)
RBC: 4.06 MIL/uL (ref 3.87–5.11)
RDW: 11.6 % (ref 11.5–15.5)
WBC: 9.9 K/uL (ref 4.0–10.5)
nRBC: 0 % (ref 0.0–0.2)

## 2024-07-25 LAB — URINALYSIS, ROUTINE W REFLEX MICROSCOPIC
Bilirubin Urine: NEGATIVE
Glucose, UA: NEGATIVE mg/dL
Hgb urine dipstick: NEGATIVE
Ketones, ur: NEGATIVE mg/dL
Leukocytes,Ua: NEGATIVE
Nitrite: NEGATIVE
Protein, ur: NEGATIVE mg/dL
Specific Gravity, Urine: 1.011 (ref 1.005–1.030)
pH: 6 (ref 5.0–8.0)

## 2024-07-25 LAB — HCG, QUANTITATIVE, PREGNANCY: hCG, Beta Chain, Quant, S: 215 m[IU]/mL — ABNORMAL HIGH

## 2024-07-25 LAB — POC URINE PREG, ED: Preg Test, Ur: POSITIVE — AB

## 2024-07-25 LAB — ABO/RH: ABO/RH(D): A POS

## 2024-07-25 NOTE — Discharge Instructions (Signed)
 You were seen in the emergency department for your vaginal bleeding.  You had an ultrasound and they were unable to see your pregnancy in your uterus.  There was a gestational sac which is usually the first finding of a pregnancy and it could be that you are just very early in your pregnancy as your pregnancy hormone was low today as well but you need to have close follow-up with your OB to ensure that you do not have an ectopic pregnancy or other complication.  They should be calling you on Monday to schedule an appointment for a lab recheck and we will schedule you an appointment for follow-up in the office.  You should return to the emergency department if you develop significantly worsening pain, heavy bleeding where you are going through more than a pad per hour, you become lightheaded or pass out or if you have any other new or concerning symptoms.

## 2024-07-25 NOTE — ED Provider Notes (Signed)
 " [place] EMERGENCY DEPARTMENT AT [facility] Provider Note   CSN: [id number] Arrival date & time: 07/25/24  9095     Patient presents with: Vaginal Bleeding   Tracie Bowen is a 23 y.o. female.   Patient is a 23 year old female approximately [redacted] weeks gestation by LMP presenting to the emergency department with vaginal bleeding.  The patient reports that she has had spotting on and off for the last 3 days that was light pink.  She states that this morning she had bleeding with bright red.  She states that the blood was only been present upon wiping and she has not filled any pads or passed any blood clots.  She states that she is having some cramping in her mid lower abdomen.  She does report having intercourse last night denies any other trauma.  She denies any fevers, nausea, vomiting, dysuria or hematuria.  He states that he has not yet had an ultrasound this pregnancy.  The history is provided by the patient.  Vaginal Bleeding      Prior to Admission medications  Medication Sig Start Date End Date Taking? Authorizing Provider  amphetamine-dextroamphetamine (ADDERALL) 10 MG tablet TAKE 1 TABLET BY MOUTH EVERY DAY IN LATE AFTERNOON FOR ATTENTION DEFICET WHILE STUDYING Patient not taking: Reported on 12/05/2021 09/17/19   [provider]  escitalopram  (LEXAPRO ) 10 MG tablet Take 1 tablet (10 mg total) by mouth daily. Patient not taking: Reported on 12/05/2021 05/10/21   Cherylene Homer HERO, NP  ondansetron  (ZOFRAN -ODT) 4 MG disintegrating tablet Take 1 tablet (4 mg total) by mouth every 8 (eight) hours as needed for nausea or vomiting. Patient not taking: Reported on 12/05/2021 05/30/21   Rolinda Rogue, MD    Allergies: Penicillins    Review of Systems  Genitourinary:  Positive for vaginal bleeding.    Updated Vital Signs BP 120/78   Pulse (!) 103   Temp 98.5 F (36.9 C) (Oral)   Resp 20   Ht 5' 1 (1.549 m)   Wt 61.7 kg   LMP 05/07/2024 (Exact Date)    SpO2 99%   BMI 25.72 kg/m   Physical Exam Vitals and nursing note reviewed.  Constitutional:      General: She is not in acute distress.    Appearance: Normal appearance.  HENT:     Head: Normocephalic and atraumatic.     Nose: Nose normal.     Mouth/Throat:     Mouth: Mucous membranes are moist.     Pharynx: Oropharynx is clear.  Eyes:     Extraocular Movements: Extraocular movements intact.     Conjunctiva/sclera: Conjunctivae normal.  Cardiovascular:     Rate and Rhythm: Normal rate and regular rhythm.     Heart sounds: Normal heart sounds.  Pulmonary:     Effort: Pulmonary effort is normal.     Breath sounds: Normal breath sounds.  Abdominal:     General: Abdomen is flat.     Palpations: Abdomen is soft.     Tenderness: There is no abdominal tenderness.  Musculoskeletal:        General: Normal range of motion.     Cervical back: Normal range of motion.  Skin:    General: Skin is warm and dry.  Neurological:     General: No focal deficit present.     Mental Status: She is alert and oriented to person, place, and time.  Psychiatric:        Mood and Affect: Mood normal.  Behavior: Behavior normal.     (all labs ordered are listed, but only abnormal results are displayed) Labs Reviewed  HCG, QUANTITATIVE, PREGNANCY - Abnormal; Notable for the following components:      Result Value   hCG, Beta Chain, Quant, S 215 (*)    All other components within normal limits  POC URINE PREG, ED - Abnormal; Notable for the following components:   Preg Test, Ur POSITIVE (*)    All other components within normal limits  CBC WITH DIFFERENTIAL/PLATELET  URINALYSIS, ROUTINE W REFLEX MICROSCOPIC  ABO/RH    EKG: None  Radiology: US  OB LESS THAN 14 WEEKS WITH OB TRANSVAGINAL Result Date: 07/25/2024 CLINICAL DATA:  Vaginal bleeding x3 days. EXAM: OBSTETRIC <14 WK US  AND TRANSVAGINAL OB US  TECHNIQUE: Both transabdominal and transvaginal ultrasound examinations were  performed for complete evaluation of the gestation as well as the maternal uterus, adnexal regions, and pelvic cul-de-sac. Transvaginal technique was performed to assess early pregnancy. COMPARISON:  None Available. FINDINGS: Intrauterine gestational sac: Single Yolk sac:  Not Visualized. Embryo:  Not Visualized. Cardiac Activity: Not Visualized. Heart Rate: N/A  bpm MSD: 9.0 mm   5 w   1 d Subchorionic hemorrhage:  None visualized. Maternal uterus/adnexae: The right ovary measures 3.6 cm x 2.2 cm x 2.5 cm and is normal in appearance. The left ovary measures 3.2 cm x 1.3 cm x 2.5 cm and is normal in appearance. A trace amount of pelvic free fluid is seen. IMPRESSION: Probable early intrauterine gestational sac, but no yolk sac, fetal pole, or cardiac activity yet visualized. Recommend follow-up quantitative B-HCG levels and follow-up US  in 14 days to assess viability. This recommendation follows SRU consensus guidelines: Diagnostic Criteria for Nonviable Pregnancy Early in the First Trimester. LOISE Diedra PARAS Med 2013; 630:8556-48. Electronically Signed   By: Suzen Dials M.D.   On: 07/25/2024 12:19     Procedures   Medications Ordered in the ED - No data to display  Clinical Course as of 07/25/24 1412  Sat Jul 25, 2024  1010 Blood type A+, does not require rhogam.  [VK]  1243 US  with gestational sac but no yolk sac or fetal pole seen concerning for pregnancy of unknown location. Will add on HCG for follow up and will need to follow up with OB for repeat HCG in 48 hours.  [VK]  1334 Patient follows with [facility] in [place]. I spoke with [facility], on call provider. They will have a nurse call her on Monday for repeat blood work and will see her in clinic next week.  [VK]    Clinical Course User Index [VK] Kingsley, Kasper Mudrick K, DO                                 Medical Decision Making This patient presents to the ED with chief complaint(s) of pregnant, vaginal bleeding with  pertinent past medical history of [redacted] weeks pregnant which further complicates the presenting complaint. The complaint involves an extensive differential diagnosis and also carries with it a high risk of complications and morbidity.    The differential diagnosis includes ectopic, miscarriage, subchorionic hemorrhage, cervical friability, vaginal laceration or tear  Additional history obtained: Additional history obtained from N/A Records reviewed Care Everywhere/External Records  ED Course and Reassessment: On patient's arrival she is mildly tachycardic and otherwise hemodynamically stable in no acute distress.  Patient had urine and urine pregnancy as well as  CBC ordered in triage.  Will additionally have pelvic ultrasound to evaluate for IUP and etiology of her bleeding.  She declined any pain control at this time and has difficulty reassessed.  Independent labs interpretation:  The following labs were independently interpreted: HCG 215, positive pregnancy, otherwise within normal range  Independent visualization of imaging: - I independently visualized the following imaging with scope of interpretation limited to determining acute life threatening conditions related to emergency care: pelvic ultrasound, which revealed gestational sac without yolk sac or fetal pole  Consultation: - Consulted or discussed management/test interpretation w/ external professional: OB  Consideration for admission or further workup: Patient has no emergent conditions requiring admission or further work-up at this time and is stable for discharge home with OB follow-up  Social Determinants of health: N/A    Amount and/or Complexity of Data Reviewed Labs: ordered. Radiology: ordered.       Final diagnoses:  Pregnancy of unknown anatomic location    ED Discharge Orders     None          Ellouise Richerd POUR, DO 07/25/24 1412  "

## 2024-07-25 NOTE — ED Triage Notes (Signed)
 Patient arrives POV from home c/c vaginal bleeding and lower abdominal cramping. States per her app, her last period began 05/07/24. She has an appt with OB next month. Spotting began 12/24, called her OBGYN office who told her she did not need to go to the ED at that time as it was light pink. It is now bright red beginning this morning. Patient was not wearing a pad to note saturation at the time. This is her first known pregnancy.
